# Patient Record
Sex: Female | Born: 1945 | Race: White | Marital: Married | Smoking: Never smoker
Health system: Southern US, Community
[De-identification: ages and names within clinical notes are randomized; demographics above are authoritative.]

## PROBLEM LIST (undated history)

## (undated) DIAGNOSIS — J309 Allergic rhinitis, unspecified: Secondary | ICD-10-CM

## (undated) DIAGNOSIS — Z8719 Personal history of other diseases of the digestive system: Secondary | ICD-10-CM

## (undated) DIAGNOSIS — K219 Gastro-esophageal reflux disease without esophagitis: Secondary | ICD-10-CM

## (undated) DIAGNOSIS — E039 Hypothyroidism, unspecified: Secondary | ICD-10-CM

## (undated) DIAGNOSIS — M810 Age-related osteoporosis without current pathological fracture: Secondary | ICD-10-CM

## (undated) DIAGNOSIS — K589 Irritable bowel syndrome without diarrhea: Secondary | ICD-10-CM

## (undated) DIAGNOSIS — M1990 Unspecified osteoarthritis, unspecified site: Secondary | ICD-10-CM

## (undated) HISTORY — DX: Hypothyroidism, unspecified: E03.9

## (undated) HISTORY — DX: Age-related osteoporosis without current pathological fracture: M81.0

## (undated) HISTORY — PX: APPENDECTOMY: SHX54

## (undated) HISTORY — DX: Personal history of other diseases of the digestive system: Z87.19

## (undated) HISTORY — DX: Irritable bowel syndrome, unspecified: K58.9

## (undated) HISTORY — DX: Allergic rhinitis, unspecified: J30.9

## (undated) HISTORY — PX: LAPAROSCOPIC SMALL BOWEL RESECTION: SUR793

## (undated) HISTORY — DX: Unspecified osteoarthritis, unspecified site: M19.90

## (undated) HISTORY — PX: RIGHT OOPHORECTOMY: SHX2359

## (undated) HISTORY — DX: Gastro-esophageal reflux disease without esophagitis: K21.9

---

## 2016-12-07 ENCOUNTER — Encounter: Payer: Self-pay | Admitting: Internal Medicine

## 2017-01-06 ENCOUNTER — Encounter: Payer: Self-pay | Admitting: *Deleted

## 2017-01-18 ENCOUNTER — Ambulatory Visit: Payer: Self-pay | Admitting: Internal Medicine

## 2017-01-23 ENCOUNTER — Ambulatory Visit (INDEPENDENT_AMBULATORY_CARE_PROVIDER_SITE_OTHER): Payer: Medicare Other | Admitting: Internal Medicine

## 2017-01-23 ENCOUNTER — Encounter: Payer: Self-pay | Admitting: Internal Medicine

## 2017-01-23 ENCOUNTER — Encounter (INDEPENDENT_AMBULATORY_CARE_PROVIDER_SITE_OTHER): Payer: Self-pay

## 2017-01-23 VITALS — BP 118/70 | HR 84 | Ht 66.0 in | Wt 125.5 lb

## 2017-01-23 DIAGNOSIS — K625 Hemorrhage of anus and rectum: Secondary | ICD-10-CM | POA: Diagnosis not present

## 2017-01-23 DIAGNOSIS — R1310 Dysphagia, unspecified: Secondary | ICD-10-CM

## 2017-01-23 DIAGNOSIS — K582 Mixed irritable bowel syndrome: Secondary | ICD-10-CM

## 2017-01-23 DIAGNOSIS — K648 Other hemorrhoids: Secondary | ICD-10-CM

## 2017-01-23 NOTE — Progress Notes (Signed)
Patient ID: Monique Walls, female   DOB: 09/11/1945, 71 y.o.   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 Kenton Walls to discuss dysphagia, IBS and intermittent rectal bleeding. She also has a history of hypothyroidism and osteoarthritis with osteoporosis. She has a surgical history notable for appendectomy, right oophorectomy and segmental small bowel resection for obstruction in February 2013.  She reports that overall she feels she is doing well. She does have intermittent flares of "irritable bowel". For her this is alternating diarrhea and constipation. Constipation seems to happen first often with changes in diet followed by loose stools. When she is constipated she notices more gas with occasional bloating. Other days she has regular formed stool once per day. She knows that overeating and also drinking wine will exacerbate symptoms. She does not take anything for her irritable bowel symptoms. She does report history of bright red blood per rectum which is very occasional. This is painless in nature. She does not have a history of known hemorrhoids. She reports her last colonoscopy was in 2013 with Dr. West Carbo and was reportedly normal. She denies a history of colon polyps or family history of colon cancer. She reports her gynecologist performed a rectal exam recently and was told this was normal.  She also reports over the last year having some solid food dysphagia. This is often without heartburn though she does endorse heartburn rarely. Sounds like she had an upper endoscopy years ago for globus and was later diagnosed with a small goiter. She reports at times she will have issues swallowing solid food particular foods that are dry. When this  occurs she states she has to stop eating and weight about 15 minutes. Usually this clears spontaneously and she can resume eating and drinking again. She denies weight loss, nausea, vomiting and odynophagia. Denies early satiety.  Past Medical History:  Diagnosis Date  . Allergic rhinitis   . GERD (gastroesophageal reflux disease)   . History of small bowel obstruction   . Hypothyroidism   . IBS (irritable bowel syndrome)   . Osteoarthritis   . Senile osteoporosis     Past Surgical History:  Procedure Laterality Date  . APPENDECTOMY    . LAPAROSCOPIC SMALL BOWEL RESECTION     due to obstruction  . RIGHT OOPHORECTOMY      Current Outpatient Prescriptions:  .  bisacodyl (DULCOLAX) 5 MG EC tablet, Take 5 mg by mouth as needed., Disp: , Rfl:  .  Calcium Citrate (CITRACAL PO), Take 1,200 mg by mouth daily., Disp: , Rfl:  .  Cholecalciferol (VITAMIN D3) 2000 units TABS, Take 1 tablet by mouth daily., Disp: , Rfl:  .  Glucosamine-Chondroitin-MSM (TRIPLE FLEX PO), Take 2 capsules by mouth daily., Disp: , Rfl:  .  Lactobacillus Rhamnosus, GG, (CULTURELLE PO), Take 1 capsule by mouth daily., Disp: , Rfl:  .  levothyroxine (SYNTHROID, LEVOTHROID) 50 MCG tablet, Take 1 tablet by mouth 2 days weekly, Disp: , Rfl:  .  levothyroxine (SYNTHROID, LEVOTHROID) 75 MCG tablet, Take 1 tablet by mouth 5 days weekly, Disp: , Rfl:  .  loratadine (CLARITIN) 10 MG tablet, Take 10 mg by mouth daily., Disp: , Rfl:  .  Multiple Vitamins-Minerals (PRESERVISION AREDS 2 PO), Take 1 capsule by mouth daily., Disp: ,  Rfl:   No Known Allergies  Family History  Problem Relation Age of Onset  . CAD Father   . Colon cancer Neg Hx   . Esophageal cancer Neg Hx   . Stomach cancer Neg Hx     Social History  Substance Use Topics  . Smoking status: Never Smoker  . Smokeless tobacco: Never Used  . Alcohol use 3.6 oz/week    6 Glasses of wine per week     Comment: occasional    ROS: As per history of present  illness, otherwise negative  BP 118/70   Pulse 84   Ht 5\' 6"  (1.676 m)   Wt 125 lb 8 oz (56.9 kg)   BMI 20.26 kg/m  Constitutional: Well-developed and well-nourished. No distress. HEENT: Normocephalic and atraumatic. Oropharynx is clear and moist. Conjunctivae are normal.  No scleral icterus. Neck: Neck supple. Trachea midline. Cardiovascular: Normal rate, regular rhythm and intact distal pulses. No M/R/G Pulmonary/chest: Effort normal and breath sounds normal. No wheezing, rales or rhonchi. Abdominal: Soft, nontender, nondistended. Bowel sounds active throughout. There are no masses palpable. No hepatosplenomegaly. Rectal: Small prolapsed internal hemorrhoids which reduce easily, no palpable masses or tenderness, hard brown stool in the rectal wall Extremities: no clubbing, cyanosis, or edema Neurological: Alert and oriented to person place and time. Skin: Skin is warm and dry.  Psychiatric: Normal mood and affect. Behavior is normal.  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 Kenton Walls to discuss dysphagia, IBS and intermittent rectal bleeding.   1. IBS with alternating loose stools and constipation -- long-standing issue for her. I recommended that she begin Benefiber 1-2 tablespoons daily to help with both loose stools and hard stools/constipation. Goal is 2 tablespoons daily over time. I also recommended and gave her a copy of the FODMAP diet.  We discussed focusing on foods that are low FODMAP foods.    2. Intermittent rectal bleeding -- likely internal hemorrhoids based on exam today. Her last colonoscopy was 5 years ago. I recommended Cologuard for screening at this time. Positive she understands she would need colonoscopy. If this issue  continues and Cologuard is negative she would be a candidate for banding for her hemorrhoids.  3. Dysphagia -- solid food related. We discussed the possibility of upper endoscopy but first will proceed to barium esophagram with tablet. This will evaluate motility and also rule out stricture and mass lesion. She understands upper endoscopy would be needed at this study is abnormal.      QM:[facility], [address], [place] [address]

## 2017-01-23 NOTE — Patient Instructions (Signed)
Please purchase the following medications over the counter and take as directed: Benefiber 1 tablespoon daily, working your way up to 2 tablespoons daily  We have given you a "FODMAP" diet to look over and follow.  Your provider has ordered Cologuard testing as an option for colon cancer screening. This is performed by [employer] and may be out of network with your insurance. PRIOR to completing the test, it is YOUR responsibility to contact your insurance about covered benefits for this test. Your out of pocket expense could be anywhere from $0.00 to $649.00.   When you call to check coverage with your insurer, please provide the following information:   -The ONLY provider of Cologuard is [place] code for Cologuard is [phone].  [employer] NPI # [id number]  -[employer] Tax ID # [id number]   We have already sent your demographic and insurance information to [employer] (phone number [phone]) and they should contact you within the next week regarding your test. If you have not heard from them within the next week, please call our office at [phone]. __________________________________________________________________ You have been scheduled for a Barium Esophogram at [facility] (1st floor of the hospital) on Thursday, 03/02/17 at 9:30 am. Please arrive 15 minutes prior to your appointment for registration. Make certain not to have anything to eat or drink 6 hours prior to your test. If you need to reschedule for any reason, please contact radiology at [phone] to do so. __________________________________________________________________ A barium swallow is an examination that concentrates on views of the esophagus. This tends to be a double contrast exam (barium and two liquids which, when combined, create a gas to distend the wall of the oesophagus) or single contrast (non-ionic iodine based). The study is  usually tailored to your symptoms so a good history is essential. Attention is paid during the study to the form, structure and configuration of the esophagus, looking for functional disorders (such as aspiration, dysphagia, achalasia, motility and reflux) EXAMINATION You may be asked to change into a gown, depending on the type of swallow being performed. A radiologist and radiographer will perform the procedure. The radiologist will advise you of the type of contrast selected for your procedure and direct you during the exam. You will be asked to stand, sit or lie in several different positions and to hold a small amount of fluid in your mouth before being asked to swallow while the imaging is performed .In some instances you may be asked to swallow barium coated marshmallows to assess the motility of a solid food bolus. The exam can be recorded as a digital or video fluoroscopy procedure. POST PROCEDURE It will take 1-2 days for the barium to pass through your system. To facilitate this, it is important, unless otherwise directed, to increase your fluids for the next 24-48hrs and to resume your normal diet.  This test typically takes about 30 minutes to perform. _______________________________________________________________________  If you are age 60 or older, your body mass index should be between 23-30. Your Body mass index is 20.26 kg/m. If this is out of the aforementioned range listed, please consider follow up with your Primary Care Provider.  If you are age 51 or younger, your body mass index should be between 19-25. Your Body mass index is 20.26 kg/m. If this is out of the aformentioned range listed, please consider follow up with your Primary Care Provider.

## 2017-03-02 ENCOUNTER — Ambulatory Visit (HOSPITAL_COMMUNITY)
Admission: RE | Admit: 2017-03-02 | Discharge: 2017-03-02 | Disposition: A | Discharge status: Home / Self Care | Payer: Medicare Other | Source: Ambulatory Visit | Attending: Internal Medicine | Admitting: Internal Medicine

## 2017-03-02 DIAGNOSIS — R1310 Dysphagia, unspecified: Secondary | ICD-10-CM | POA: Diagnosis not present

## 2017-03-27 ENCOUNTER — Telehealth: Payer: Self-pay

## 2017-03-27 NOTE — Telephone Encounter (Signed)
Cologuard has been suspended due to inactivity.

## 2017-04-27 ENCOUNTER — Other Ambulatory Visit: Payer: Self-pay

## 2017-04-27 LAB — COLOGUARD: Cologuard: NEGATIVE

## 2017-04-27 NOTE — Telephone Encounter (Signed)
Cologuard results are resulted as of today (04-27-2017)

## 2019-03-22 IMAGING — RF DG ESOPHAGUS
10 of 13 series · 13 of 24 positions shown · non-contrast
Comparison: None.

CLINICAL DATA: 71-year-old female with solid food dysphagia.

EXAM:
ESOPHOGRAM / BARIUM SWALLOW / BARIUM TABLET STUDY
TECHNIQUE: Combined double contrast and single contrast examination performed
using effervescent crystals, thick barium liquid, and thin barium
liquid. The patient was observed with fluoroscopy swallowing a 13 mm
barium sulphate tablet.
FLUOROSCOPY TIME:  Fluoroscopy Time:  1 minutes 30 seconds
Radiation Exposure Index (if provided by the fluoroscopic device):
E.FmNy
Number of Acquired Spot Images: 0

[Series 1: cp_standard · 0.54mm/px · 2 of 20 frames shown (1 of 10)]
[frame 4/20]
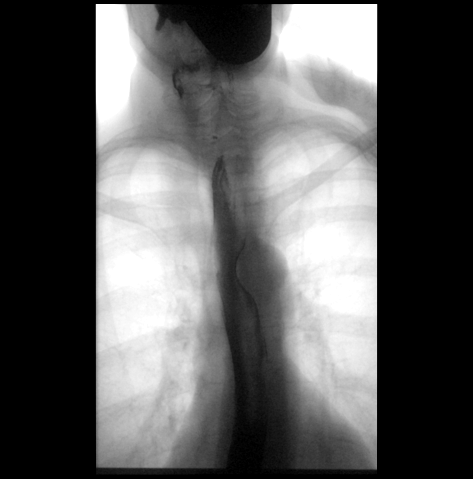
[frame 18/20]
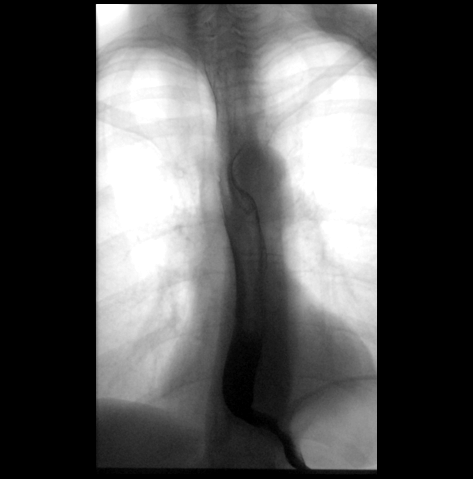

[Series 3: cp_standard · 0.18mm/px · 1 of 1 slices shown (2 of 10)]
[im 1/1]
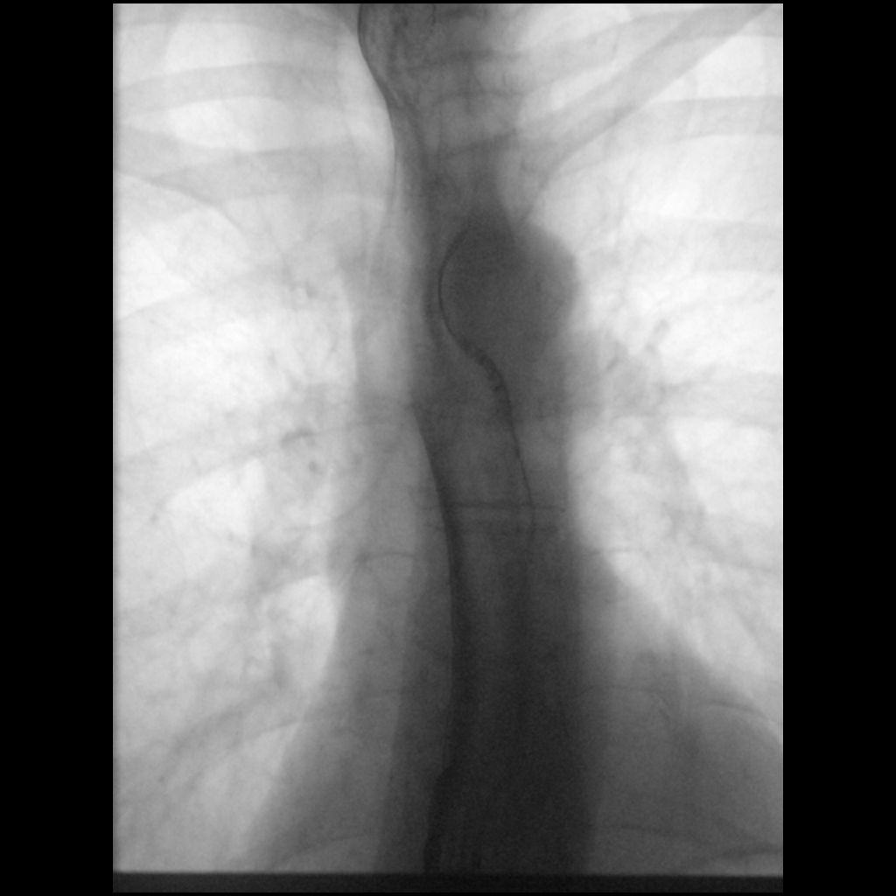

[Series 6: cp_standard · 0.18mm/px · 1 of 1 slices shown (3 of 10)]
[im 1/1]
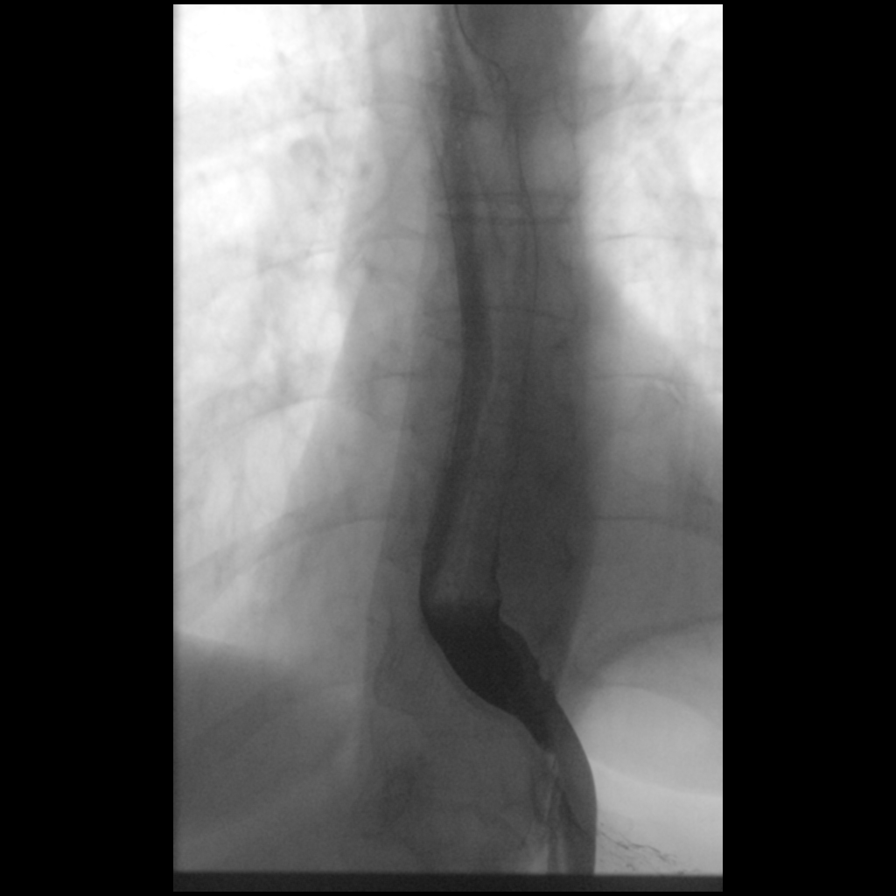

[Series 8: cp_standard · 0.26mm/px · 1 of 1 slices shown (4 of 10)]
[im 1/1]
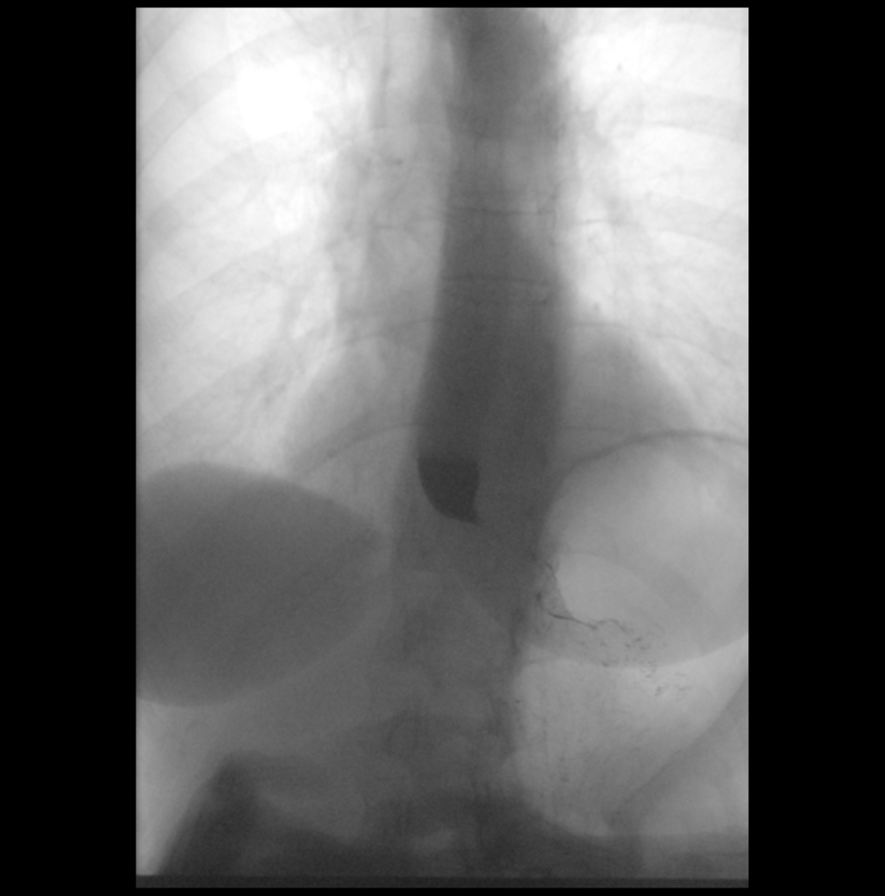

[Series 10: cp_standard · 0.35mm/px · 1 of 19 frames shown (5 of 10)]
[frame 10/19]
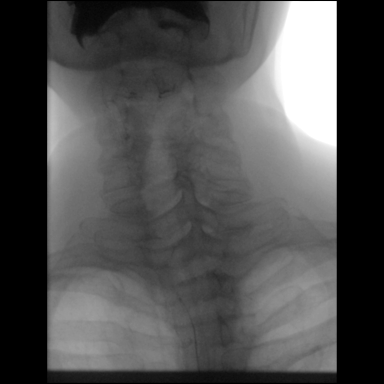

[Series 11: cp_standard · 0.35mm/px · 2 of 35 frames shown (6 of 10)]
[frame 6/35]
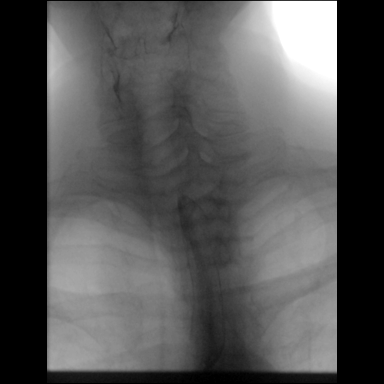
[frame 11/35]
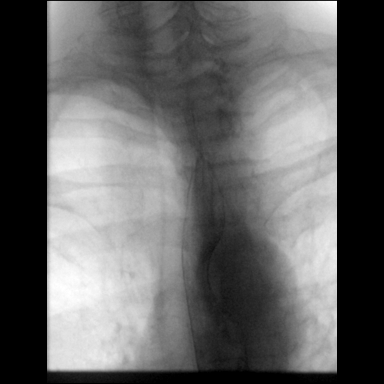

[Series 12: cp_standard · 0.34mm/px · 2 of 15 frames shown (7 of 10)]
[frame 3/15]
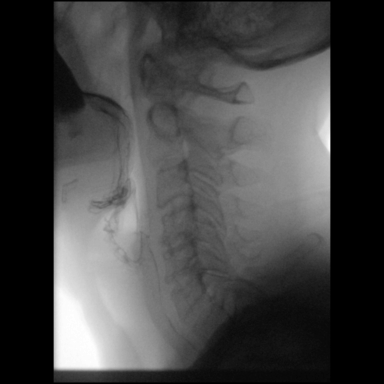
[frame 12/15]
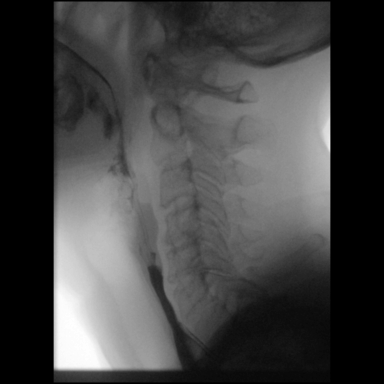

[Series 14: cp_standard · 0.27mm/px · 1 of 1 slices shown (8 of 10)]
[im 1/1]
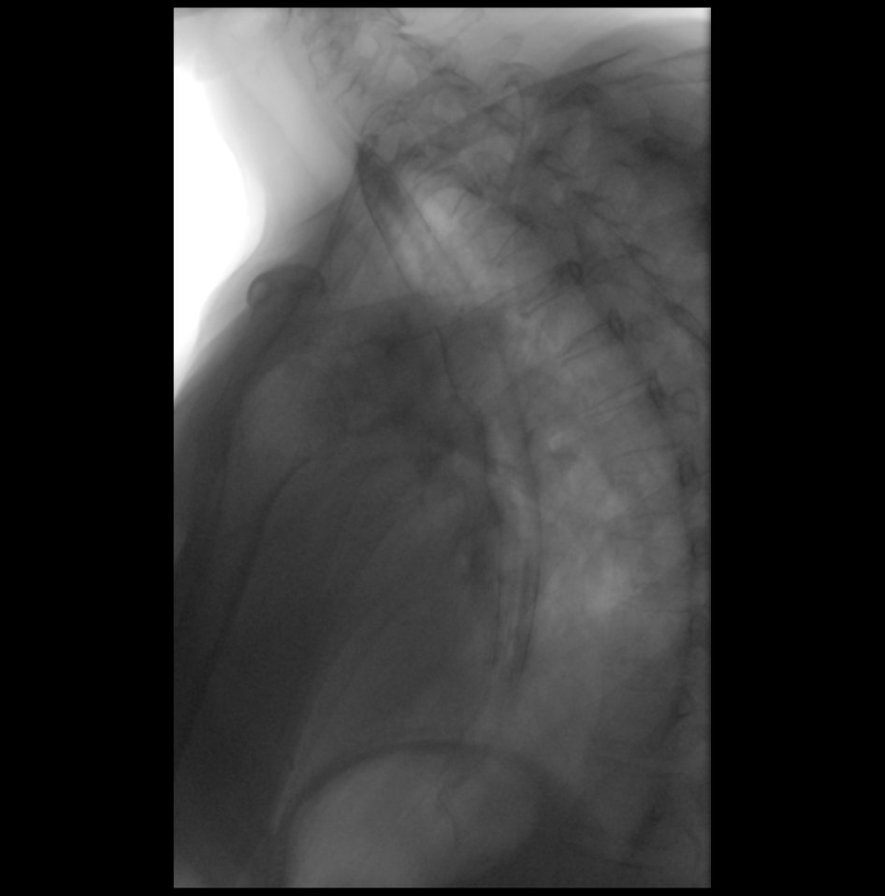

[Series 15: cp_standard · 0.55mm/px · 1 of 47 frames shown (9 of 10)]
[frame 8/47]
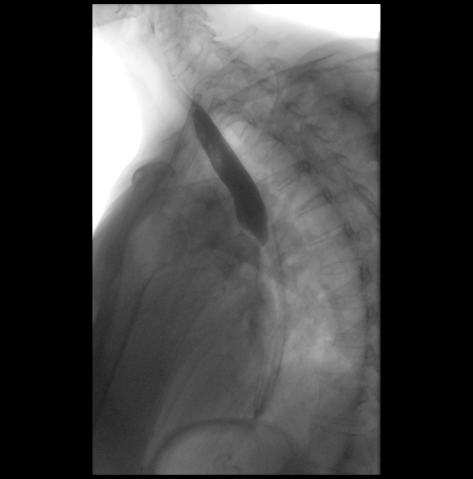

[Series 16: cp_standard · 0.18mm/px · 1 of 1 slices shown (10 of 10)]
[im 1/1]
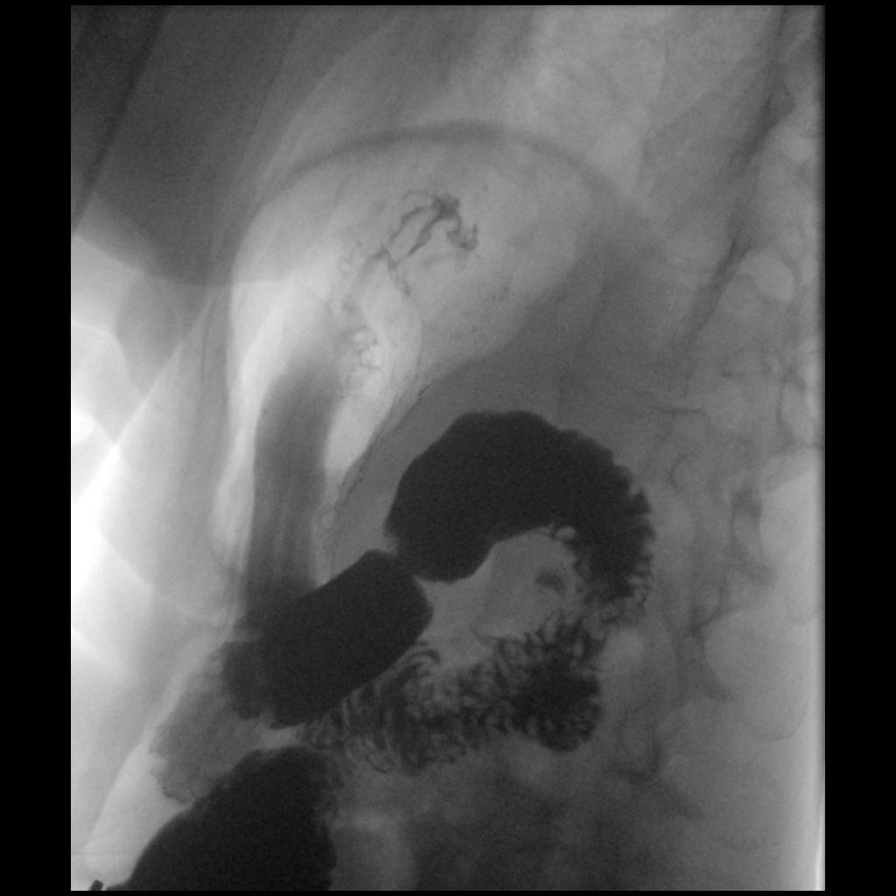

[13 of 24 positions shown; findings below may reference images not displayed]

FINDINGS: A double contrast study was undertaken and the patient tolerated
this well and without difficulty.

No obstruction to the forward flow of contrast throughout the
esophagus and into the stomach. Normal esophageal course and
contour. Normal esophageal mucosal pattern.

A 12.5 mm barium tablet was administered and passed freely to the
stomach without delay.

Dedicated imaging of the cervical esophagus is normal. No aspiration
occurred.

On prone swallow esophageal motility is normal for age.

Incidental prompt gastric emptying. No gastroesophageal reflux
occurred spontaneously or was elicited.
IMPRESSION: 1. Normal for age barium esophagram.
2. If dysphagia symptoms persist, perhaps an evaluation by Speech
Pathology would be valuable.

## 2020-01-29 ENCOUNTER — Telehealth: Payer: Self-pay | Admitting: *Deleted

## 2020-01-29 NOTE — Telephone Encounter (Signed)
error 

## 2020-03-11 ENCOUNTER — Telehealth: Payer: Self-pay | Admitting: *Deleted

## 2020-03-11 NOTE — Telephone Encounter (Signed)
Cologuard letter mailed to patient to advise that she may potentially need repeat cologuard.

## 2020-08-18 ENCOUNTER — Ambulatory Visit (INDEPENDENT_AMBULATORY_CARE_PROVIDER_SITE_OTHER): Payer: Medicare Other | Admitting: Internal Medicine

## 2020-08-18 ENCOUNTER — Encounter: Payer: Self-pay | Admitting: Internal Medicine

## 2020-08-18 ENCOUNTER — Ambulatory Visit: Payer: Medicare Other | Admitting: Internal Medicine

## 2020-08-18 VITALS — BP 150/80 | HR 76 | Ht 66.0 in | Wt 127.0 lb

## 2020-08-18 DIAGNOSIS — K582 Mixed irritable bowel syndrome: Secondary | ICD-10-CM

## 2020-08-18 DIAGNOSIS — K625 Hemorrhage of anus and rectum: Secondary | ICD-10-CM | POA: Diagnosis not present

## 2020-08-18 MED ORDER — SUTAB 1479-225-188 MG PO TABS: 1.0000 | ORAL_TABLET | ORAL | 0 refills | Status: DC

## 2020-08-18 NOTE — Patient Instructions (Addendum)
If you are age 75 or older, your body mass index should be between 23-30. Your Body mass index is 20.5 kg/m. If this is out of the aforementioned range listed, please consider follow up with your Primary Care Provider.  If you are age 67 or younger, your body mass index should be between 19-25. Your Body mass index is 20.5 kg/m. If this is out of the aformentioned range listed, please consider follow up with your Primary Care Provider.   You have been scheduled for a colonoscopy. Please follow written instructions given to you at your visit today.  Please pick up your prep supplies at the pharmacy within the next 1-3 days. If you use inhalers (even only as needed), please bring them with you on the day of your procedure.   It was great seeing you today!  Thank you for entrusting me with your care and choosing [facility].  Dr. Hilarie Fredrickson

## 2020-08-18 NOTE — Progress Notes (Signed)
Patient ID: STEPHINE LANGBEHN, female   DOB: 1946/06/06, 74 y.o.   MRN: [id number] HPI: Jaydy Fitzhenry is a 73 year old female with a history of GERD, prior small bowel obstruction, IBS, osteoporosis who is seen to evaluate rectal bleeding.  She is here alone today.  She is known to me from an office visit in May 2018 at which point we discussed her IBS with alternating bowel habits, intermittent rectal bleeding and dysphagia symptom.  At that visit her rectal bleeding was felt secondary to hemorrhoids and she had a negative Cologuard shortly after this visit.  We also performed a barium esophagram to evaluate dysphagia which was largely unremarkable.  She reports that she had been doing well but she developed some bright red blood per rectum which lasted over an approximate 5-day.  About a month ago.  She reports it was enough blood that she was concerned and wanted to be seen again.  She describes seeing some red blood per rectum after forceful gas was passed.  Bowel movements have not changed and vary between loose to hard.  No true diarrhea.  Bowel movements usually occur 1-3 times per day.  Stools can be thin or large in caliber.  She has not had abdominal bloating or gas symptom.  No abdominal pain.  Bleeding has subsequently resolved.  There was some mild perianal irritation when she was seeing the bleeding about a month ago.  Currently no upper GI or hepatobiliary complaint.  She had a negative colonoscopy in 2003 which I have documentation of done by Dr. West Carbo.  She specifically remembers a second colonoscopy 10 years later also by Dr. West Carbo in 2013.  She recalls this being normal.  I do not have record of the 2013 exam.  Past Medical History:  Diagnosis Date  . Allergic rhinitis   . GERD (gastroesophageal reflux disease)   . History of small bowel obstruction   . Hypothyroidism   . IBS (irritable bowel syndrome)   . Osteoarthritis   . Senile osteoporosis     Past Surgical History:   Procedure Laterality Date  . APPENDECTOMY    . LAPAROSCOPIC SMALL BOWEL RESECTION     due to obstruction  . RIGHT OOPHORECTOMY      Outpatient Medications Prior to Visit  Medication Sig Dispense Refill  . atorvastatin (LIPITOR) 20 MG tablet Take 20 mg by mouth daily.    . calcium carbonate (OS-CAL - DOSED IN MG OF ELEMENTAL CALCIUM) 1250 (500 Ca) MG tablet Take 1 tablet by mouth 2 (two) times daily with a meal.    . Glucosamine-Chondroitin-MSM (TRIPLE FLEX PO) Take 1 capsule by mouth daily.    . Lactobacillus Rhamnosus, GG, (CULTURELLE PO) Take 1 capsule by mouth daily.    Marland Kitchen levothyroxine (SYNTHROID, LEVOTHROID) 50 MCG tablet Take 1 tablet by mouth 2 days weekly    . levothyroxine (SYNTHROID, LEVOTHROID) 75 MCG tablet Take 1 tablet by mouth 5 days weekly    . Multiple Vitamins-Minerals (PRESERVISION AREDS 2 PO) Take 1 capsule by mouth daily.    . bisacodyl (DULCOLAX) 5 MG EC tablet Take 5 mg by mouth as needed.    . Calcium Citrate (CITRACAL PO) Take 1,200 mg by mouth daily.    . Cholecalciferol (VITAMIN D3) 2000 units TABS Take 1 tablet by mouth daily.    Marland Kitchen loratadine (CLARITIN) 10 MG tablet Take 10 mg by mouth daily.     No facility-administered medications prior to visit.    No Known Allergies  Family  History  Problem Relation Age of Onset  . CAD Father   . Colon cancer Neg Hx   . Esophageal cancer Neg Hx   . Stomach cancer Neg Hx     Social History   Tobacco Use  . Smoking status: Never Smoker  . Smokeless tobacco: Never Used  Vaping Use  . Vaping Use: Never used  Substance Use Topics  . Alcohol use: Yes    Alcohol/week: 6.0 standard drinks    Types: 6 Glasses of wine per week    Comment: occasional  . Drug use: No    ROS: As per history of present illness, otherwise negative  BP (!) 150/80   Pulse 76   Ht 5\' 6"  (1.676 m)   Wt 127 lb (57.6 kg)   BMI 20.50 kg/m  Gen: awake, alert, NAD HEENT: anicteric  CV: RRR, no mrg Pulm: CTA b/l Abd: soft, NT/ND,  +BS throughout Ext: no c/c/e Neuro: nonfocal  ASSESSMENT/PLAN: 74 year old female with a history of GERD, prior small bowel obstruction, IBS, osteoporosis who is seen to evaluate rectal bleeding.  1.  Rectal bleeding --now resolved and while this may be explained by internal hemorrhoids I recommended that we repeat colonoscopy to rule out other pathology.  We discussed the risk, benefits and alternatives and she is agreeable and wishes to proceed.  We discussed having this done in January or February, which is her preference as she will be going with her family to [place] over the holidays. --Colonoscopy in the [place]  2.  Alternating bowel habit --likely irritable bowel related which is longstanding.  I recommended that she try low-dose Benefiber 1 teaspoon working up to 1 tablespoon daily.  She may choose to start this after the colonoscopic evaluation    Cc: Dr. Truett Mainland, [facility]

## 2020-10-14 ENCOUNTER — Other Ambulatory Visit: Payer: Self-pay

## 2020-10-14 ENCOUNTER — Encounter: Payer: Self-pay | Admitting: Internal Medicine

## 2020-10-14 ENCOUNTER — Ambulatory Visit (AMBULATORY_SURGERY_CENTER): Payer: Medicare Other | Admitting: Internal Medicine

## 2020-10-14 VITALS — BP 138/70 | HR 76 | Temp 98.9°F | Resp 11 | Ht 66.0 in | Wt 127.0 lb

## 2020-10-14 DIAGNOSIS — K635 Polyp of colon: Secondary | ICD-10-CM

## 2020-10-14 DIAGNOSIS — K648 Other hemorrhoids: Secondary | ICD-10-CM

## 2020-10-14 DIAGNOSIS — D122 Benign neoplasm of ascending colon: Secondary | ICD-10-CM

## 2020-10-14 DIAGNOSIS — K5730 Diverticulosis of large intestine without perforation or abscess without bleeding: Secondary | ICD-10-CM

## 2020-10-14 DIAGNOSIS — K625 Hemorrhage of anus and rectum: Secondary | ICD-10-CM

## 2020-10-14 MED ORDER — SODIUM CHLORIDE 0.9 % IV SOLN: 500.0000 mL | Freq: Once | INTRAVENOUS | Status: DC

## 2020-10-14 NOTE — Progress Notes (Signed)
Pt Drowsy. VSS. To PACU, report to RN. No anesthetic complications noted.  

## 2020-10-14 NOTE — Patient Instructions (Signed)

## 2020-10-14 NOTE — Progress Notes (Signed)
Called to room to assist during endoscopic procedure.  Patient ID and intended procedure confirmed with present staff. Received instructions for my participation in the procedure from the performing physician.  

## 2020-10-14 NOTE — Progress Notes (Signed)
VS taken by C.W. 

## 2020-10-14 NOTE — Op Note (Signed)
[place] Patient Name: Monique Walls Procedure Date: 10/14/2020 2:04 PM MRN: [id number] Endoscopist: Jerene Bears , MD Age: 75 Referring MD:  Date of Birth: [birth year] Gender: Female Account #: [id number] Procedure:                Colonoscopy Indications:              Rectal bleeding Medicines:                Monitored Anesthesia Care Procedure:                Pre-Anesthesia Assessment:                           - Prior to the procedure, a History and Physical                            was performed, and patient medications and                            allergies were reviewed. The patient's tolerance of                            previous anesthesia was also reviewed. The risks                            and benefits of the procedure and the sedation                            options and risks were discussed with the patient.                            All questions were answered, and informed consent                            was obtained. Prior Anticoagulants: The patient has                            taken no previous anticoagulant or antiplatelet                            agents. ASA Grade Assessment: II - A patient with                            mild systemic disease. After reviewing the risks                            and benefits, the patient was deemed in                            satisfactory condition to undergo the procedure.                           After obtaining informed consent, the colonoscope  was passed under direct vision. Throughout the                            procedure, the patient's blood pressure, pulse, and                            oxygen saturations were monitored continuously. The                            Olympus [id number]-[id number]) Colonoscope was                            introduced through the anus and advanced to the                            cecum, identified by appendiceal orifice and                             ileocecal valve. The colonoscopy was performed                            without difficulty. The patient tolerated the                            procedure well. The quality of the bowel                            preparation was good. The ileocecal valve,                            appendiceal orifice, and rectum were photographed. Scope In: 2:10:44 PM Scope Out: 2:34:18 PM Scope Withdrawal Time: 0 hours 15 minutes 34 seconds  Total Procedure Duration: 0 hours 23 minutes 34 seconds  Findings:                 The digital rectal exam was normal.                           A 5 mm polyp was found in the ascending colon. The                            polyp was sessile. The polyp was removed with a                            cold snare. Resection and retrieval were complete.                           Multiple small and large-mouthed diverticula were                            found in the sigmoid colon, descending colon and                            transverse colon.  Internal hemorrhoids were found during                            retroflexion. The hemorrhoids were medium-sized. Complications:            No immediate complications. Estimated Blood Loss:     Estimated blood loss was minimal. Impression:               - One 5 mm polyp in the ascending colon, removed                            with a cold snare. Resected and retrieved.                           - Diverticulosis in the sigmoid colon, in the                            descending colon and in the transverse colon.                           - Internal hemorrhoids. This is the most likely                            source of bright red blood per rectum. Recommendation:           - Patient has a contact number available for                            emergencies. The signs and symptoms of potential                            delayed complications were discussed with the                             patient. Return to normal activities tomorrow.                            Written discharge instructions were provided to the                            patient.                           - Resume previous diet.                           - Continue present medications.                           - Await pathology results.                           - If hemorrhoidal bleeding continues to be a                            problem, hemorrhoidal banding would be a good  treatment option.                           - No recommendation at this time regarding repeat                            colonoscopy due to age at next                            screening/surveillance interval. Jerene Bears, MD 10/14/2020 2:40:39 PM This report has been signed electronically.

## 2020-10-16 ENCOUNTER — Telehealth: Payer: Self-pay | Admitting: *Deleted

## 2020-10-16 NOTE — Telephone Encounter (Signed)
  Follow up Call-  Call back number 10/14/2020  Post procedure Call Back phone  # [phone]  Permission to leave phone message Yes  Some recent data might be hidden     Patient questions:  Do you have a fever, pain , or abdominal swelling? No. Pain Score  0 *  Have you tolerated food without any problems? Yes.    Have you been able to return to your normal activities? Yes.    Do you have any questions about your discharge instructions: Diet   No. Medications  No. Follow up visit  No.  Do you have questions or concerns about your Care? No.  Actions: * If pain score is 4 or above: No action needed, pain <4.  1. Have you developed a fever since your procedure? no  2.   Have you had an respiratory symptoms (SOB or cough) since your procedure? no  3.   Have you tested positive for COVID 19 since your procedure no  4.   Have you had any family members/close contacts diagnosed with the COVID 19 since your procedure?  no   If yes to any of these questions please route to Joylene John, RN and Joella Prince, RN

## 2020-10-22 ENCOUNTER — Encounter: Payer: Self-pay | Admitting: Internal Medicine

## 2022-03-30 ENCOUNTER — Telehealth: Payer: Self-pay

## 2022-03-30 NOTE — Telephone Encounter (Signed)
Pt stated that she has been noticing for the past three weeks that she is having some rectal bleeding when she wipes: No other symptoms: Pt states that this is not a large amount of blood and she does have a history of hemorrhoids:  Pt was scheduled for an office visit with Vicie Mutters PA  for evaluation on 04/13/2022 at 1:30 PM: Pt verbalized understanding with all questions answered.

## 2022-04-12 NOTE — Progress Notes (Unsigned)
04/13/2022 Monique Walls [id number] 27-Aug-1946  Referring provider: Allena Napoleon Her* Primary GI doctor: Dr. Hilarie Fredrickson  ASSESSMENT AND PLAN:  Assessment: 76 y.o. female here for assessment of the following: 1. Rectal bleeding   2. Benign neoplasm of ascending colon   3. Irritable bowel syndrome with both constipation and diarrhea   4. Hemorrhoids, unspecified hemorrhoid type    10/14/2020 colonoscopy with Dr. Hilarie Fredrickson for rectal bleeding showed good bowel prep.  5 mm SS polyp in ascending colon diverticula sigmoid descending and transverse.  Internal hemorrhoids.   Plan: -Sitz baths, increase fiber, increase water, need to fix toilet habits.  -Hydrocortisone supp give and external cream sent in.  -We discussed hemorrhoid banding here in the office, will follow up after 2 months, if not improved can get evaluated with Dr. Hilarie Fredrickson.    Meds ordered this encounter  Medications   hydrocortisone (ANUSOL-HC) 2.5 % rectal cream    Sig: Place 1 Application rectally 2 (two) times daily.    Dispense:  30 g    Refill:  2   hydrocortisone (ANUSOL-HC) 25 MG suppository    Sig: Place 1 suppository (25 mg total) rectally 2 (two) times daily.    Dispense:  12 suppository    Refill:  0     History of Present Illness:  76 y.o. female  with a past medical history of GERD, prior small bowel obstruction, IBS, osteoporosis who is seen to evaluate rectal bleeding and others listed below, returns to clinic today for evaluation of rectal bleeding. 08/18/2020 office visit with Dr. Hilarie Fredrickson.   10/14/2020 colonoscopy with Dr. Hilarie Fredrickson for rectal bleeding showed good bowel prep.  5 mm SS polyp in ascending colon diverticula sigmoid descending and transverse.  Internal hemorrhoids.  Surveillance to be done in 5 years however due to age will evaluate closer time 2018 normal barium swallow for dysphagia. 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  Referred to [facility] hepatologist, abnormality thought most likely reflection of collateralization in setting of abdominal resection.  Hepatitis C negative for screening  Current Medications:   Current Outpatient Medications (Endocrine & Metabolic):    levothyroxine (SYNTHROID, LEVOTHROID) 50 MCG tablet, Take 1 tablet by mouth 2 days weekly   levothyroxine (SYNTHROID, LEVOTHROID) 75 MCG tablet, Take 1 tablet by mouth 5 days weekly  Current Outpatient Medications (Cardiovascular):    atorvastatin (LIPITOR) 20 MG tablet, Take 20 mg by mouth daily.   Current Outpatient Medications (Analgesics):    aspirin EC 81 MG tablet, Take 81 mg by mouth daily. Swallow whole.   Current Outpatient Medications (Other):    hydrocortisone (ANUSOL-HC) 2.5 % rectal cream, Place 1 Application rectally 2 (two) times daily.   hydrocortisone (ANUSOL-HC) 25 MG suppository, Place 1 suppository (25 mg total) rectally 2 (two) times daily.   Lactobacillus Rhamnosus, GG, (CULTURELLE PO), Take 1 capsule by mouth daily.   Lactobacillus-Inulin (CULTURELLE ADULT ULT BALANCE PO), Take by mouth daily.   Multiple Vitamins-Minerals (PRESERVISION AREDS 2 PO), Take 1 capsule by mouth daily.   Oyster Shell 500 MG TABS, Take by mouth daily.  Surgical History:  She  has a past surgical history that includes Laparoscopic small bowel resection; Right oophorectomy; and Appendectomy. Family History:  Her family history includes CAD in her father. Social History:   reports that she has never smoked. She has never used smokeless tobacco. She reports current alcohol use  of about 6.0 standard drinks of alcohol per week. She reports that she does not use drugs.  Current Medications, Allergies, Past Medical History, Past Surgical History, Family History and Social History were reviewed in [employer] record.  Physical Exam: BP 126/64   Pulse 70   Ht '5\' 6"'$  (1.676 m)   Wt  129 lb (58.5 kg)   BMI 20.82 kg/m  General:   Pleasant, well developed female in no acute distress Heart : Regular rate and rhythm; no murmurs Pulm: Clear anteriorly; no wheezing Abdomen:  Soft, Non-distended AB, Active bowel sounds. No tenderness . Without guarding and Without rebound, No organomegaly appreciated. Rectal: Normal external rectal exam, possible healing posterior fissure, normal rectal tone, large appreciated internal hemorrhoids, non-tender, no masses, scant brown stool, hemoccult Positive Extremities:  without  edema. Neurologic:  Alert and  oriented x4;  No focal deficits.  Psych:  Cooperative. Normal mood and affect.   Vladimir Crofts, PA-C 04/13/22

## 2022-04-13 ENCOUNTER — Encounter: Payer: Self-pay | Admitting: Physician Assistant

## 2022-04-13 ENCOUNTER — Ambulatory Visit (INDEPENDENT_AMBULATORY_CARE_PROVIDER_SITE_OTHER): Payer: Medicare Other | Admitting: Physician Assistant

## 2022-04-13 VITALS — BP 126/64 | HR 70 | Ht 66.0 in | Wt 129.0 lb

## 2022-04-13 DIAGNOSIS — D122 Benign neoplasm of ascending colon: Secondary | ICD-10-CM | POA: Diagnosis not present

## 2022-04-13 DIAGNOSIS — K649 Unspecified hemorrhoids: Secondary | ICD-10-CM | POA: Diagnosis not present

## 2022-04-13 DIAGNOSIS — K582 Mixed irritable bowel syndrome: Secondary | ICD-10-CM

## 2022-04-13 DIAGNOSIS — K625 Hemorrhage of anus and rectum: Secondary | ICD-10-CM

## 2022-04-13 MED ORDER — HYDROCORTISONE ACETATE 25 MG RE SUPP: 25.0000 mg | Freq: Two times a day (BID) | RECTAL | 0 refills | Status: DC

## 2022-04-13 MED ORDER — HYDROCORTISONE (PERIANAL) 2.5 % EX CREA: 1.0000 | TOPICAL_CREAM | Freq: Two times a day (BID) | CUTANEOUS | 2 refills | Status: DC

## 2022-04-13 NOTE — Patient Instructions (Addendum)
Please do sitz baths- these can be found at the pharmacy. It is a English as a second language teacher that is put in your toliet.  Will send in hydrocoritsone suppository, cheapest with [employer]RX from [employer], [employer], [employer] or [employer] if your insurance does not pay for it. If the hemorrhoid suppository sent in is too expensive you can do this over the counter trick.  Apply a pea size amount of over the counter Anusol HC cream to the tip of an over the counter PrepH suppository and insert rectally once every night for at least 7 nights.  If this does not improve there are procedures that can be done.   Miralax is an osmotic laxative.  It only brings more water into the stool.  This is safe to take daily.  Can take up to 17 gram of miralax twice a day.  Mix with juice or coffee.  Start 1 capful at night for 3-4 days and reassess your response in 3-4 days.  You can increase and decrease the dose based on your response.  Remember, it can take up to 3-4 days to take effect OR for the effects to wear off.   I often pair this with benefiber in the morning to help assure the stool is not too loose.   Toileting tips to help with your constipation - Drink at least 64-80 ounces of water/liquid per day. - Establish a time to try to move your bowels every day.  For many people, this is after a cup of coffee or after a meal such as breakfast. - Sit all of the way back on the toilet keeping your back fairly straight and while sitting up, try to rest the tops of your forearms on your upper thighs.   - Raising your feet with a step stool/squatty potty can be helpful to improve the angle that allows your stool to pass through the rectum. - Relax the rectum feeling it bulge toward the toilet water.  If you feel your rectum raising toward your body, you are contracting rather than relaxing. - Breathe in and slowly exhale. "Belly breath" by expanding your belly towards your belly button. Keep belly expanded as you gently direct  pressure down and back to the anus.  A low pitched GRRR sound can assist with increasing intra-abdominal pressure.  - Repeat 3-4 times. If unsuccessful, contract the pelvic floor to restore normal tone and get off the toilet.  Avoid excessive straining. - To reduce excessive wiping by teaching your anus to normally contract, place hands on outer aspect of knees and resist knee movement outward.  Hold 5-10 second then place hands just inside of knees and resist inward movement of knees.  Hold 5 seconds.  Repeat a few times each way.   About Hemorrhoids  Hemorrhoids are swollen veins in the lower rectum and anus.  Also called piles, hemorrhoids are a common problem.  Hemorrhoids may be internal (inside the rectum) or external (around the anus).  Internal Hemorrhoids  Internal hemorrhoids are often painless, but they rarely cause bleeding.  The internal veins may stretch and fall down (prolapse) through the anus to the outside of the body.  The veins may then become irritated and painful.  External Hemorrhoids  External hemorrhoids can be easily seen or felt around the anal opening.  They are under the skin around the anus.  When the swollen veins are scratched or broken by straining, rubbing or wiping they sometimes bleed.  How Hemorrhoids Occur  Veins in the  rectum and around the anus tend to swell under pressure.  Hemorrhoids can result from increased pressure in the veins of your anus or rectum.  Some sources of pressure are:  Straining to have a bowel movement because of constipation Waiting too long to have a bowel movement Coughing and sneezing often Sitting for extended periods of time, including on the toilet Diarrhea Obesity Trauma or injury to the anus Some liver diseases Stress Family history of hemorrhoids Pregnancy  Pregnant women should try to avoid becoming constipated, because they are more likely to have hemorrhoids during pregnancy.  In the last trimester of pregnancy,  the enlarged uterus may press on blood vessels and causes hemorrhoids.  In addition, the strain of childbirth sometimes causes hemorrhoids after the birth.  Symptoms of Hemorrhoids  Some symptoms of hemorrhoids include: Swelling and/or a tender lump around the anus Itching, mild burning and bleeding around the anus Painful bowel movements with or without constipation Bright red blood covering the stool, on toilet paper or in the toilet bowel.   Symptoms usually go away within a few days.  Always talk to your doctor about any bleeding to make sure it is not from some other causes.  Diagnosing and Treating Hemorrhoids  Diagnosis is made by an examination by your healthcare provider.  Special test can be performed by your doctor.    Most cases of hemorrhoids can be treated with: High-fiber diet: Eat more high-fiber foods, which help prevent constipation.  Ask for more detailed fiber information on types and sources of fiber from your healthcare provider. Fluids: Drink plenty of water.  This helps soften bowel movements so they are easier to pass. Sitz baths and cold packs: Sitting in lukewarm water two or three times a day for 15 minutes cleases the anal area and may relieve discomfort.  If the water is too hot, swelling around the anus will get worse.  Placing a cloth-covered ice pack on the anus for ten minutes four times a day can also help reduce selling.  Gently pushing a prolapsed hemorrhoid back inside after the bath or ice pack can be helpful. Medications: For mild discomfort, your healthcare provider may suggest over-the-counter pain medication or prescribe a cream or ointment for topical use.  The cream may contain witch hazel, zinc oxide or petroleum jelly.  Medicated suppositories are also a treatment option.  Always consult your doctor before applying medications or creams. Procedures and surgeries: There are also a number of procedures and surgeries to shrink or remove hemorrhoids in  more serious cases.  Talk to your physician about these options.  You can often prevent hemorrhoids or keep them from becoming worse by maintaining a healthy lifestyle.  Eat a fiber-rich diet of fruits, vegetables and whole grains.  Also, drink plenty of water and exercise regularly.   2007, [employer] Doc.30  I appreciate the  opportunity to care for you  Thank You   [facility]

## 2022-04-14 NOTE — Progress Notes (Signed)
Addendum: Reviewed and agree with assessment and management plan. Deb Loudin M, MD  

## 2022-06-10 ENCOUNTER — Ambulatory Visit: Payer: Medicare Other | Admitting: Internal Medicine

## 2022-06-21 ENCOUNTER — Ambulatory Visit: Payer: Medicare Other | Admitting: Internal Medicine

## 2022-06-28 ENCOUNTER — Encounter: Payer: Self-pay | Admitting: Internal Medicine

## 2022-06-28 ENCOUNTER — Ambulatory Visit (INDEPENDENT_AMBULATORY_CARE_PROVIDER_SITE_OTHER): Payer: Medicare Other | Admitting: Internal Medicine

## 2022-06-28 VITALS — BP 124/80 | HR 86 | Ht 66.0 in | Wt 130.2 lb

## 2022-06-28 DIAGNOSIS — K582 Mixed irritable bowel syndrome: Secondary | ICD-10-CM

## 2022-06-28 DIAGNOSIS — K648 Other hemorrhoids: Secondary | ICD-10-CM | POA: Diagnosis not present

## 2022-06-28 NOTE — Progress Notes (Signed)
   Subjective:    Patient ID: Monique Walls, female    DOB: [birth year], 76 y.o.   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  She is here alone today and was last seen on 04/13/2022 by Tye Savoy.  She reports that she is doing well.  Her irritable bowel and bowel habits are very controlled as long as she is at home eating a very routine diet.  However she enjoys traveling and when she travels stools at times can vary and she can have painless rectal bleeding.  She attributes this to her hemorrhoids.  Can even occur with "forceful gas".  Always bright red in nature.  Certainly intermittent and not daily.  She was eating fiber 1 cereal which seem to be helpful but she has found it hard to find.  She took Metamucil 1 time and felt like her stools were too loose.  She is not having nominal pain.  She denies upper GI and hepatobiliary complaint.  She has thought about hemorrhoidal banding but has more questions and worries about timing.  She will be traveling to [place] in the [place] on a 4-week trip including cruise from Thanksgiving week to mid December.   Review of Systems As per HPI, otherwise negative  Current Medications, Allergies, Past Medical History, Past Surgical History, Family History and Social History were reviewed in [employer] record.    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  Bleeding internal hemorrhoids --we discussed her hemorrhoids today and I do think she would be an excellent candidate for hemorrhoidal banding.  She wishes to defer this today until her husband can be here to drive her home.  She also does not want to proceed until she is back from her [place] vacation.  She is interested in January 2024.  In the interim I recommended that she continue high-fiber diet and consider trying Benefiber 1 teaspoon to 1 tablespoon daily. -- Hemorrhoidal banding visit in January 2024  2.  IBS with alternating bowel habit --continue high-fiber diet with liberal fluid intake.  I recommended she consider Benefiber 1 teaspoon to 1 table twice daily.  3.  Small SSP of the colon --colonoscopy performed February 2022, repeat for surveillance would not be indicated before February 2027 at which point she will be 76 years old.  We can discuss at that time but would like to be deferred due to age.  4.  Intrahepatic shunt --seen by Dr. Damita Lack at [facility].  This is benign and not causing any evidence of portal hypertension.  No reimaging needed.  30 minutes total spent today including patient facing time, coordination of care, reviewing medical history/procedures/pertinent radiology studies, and documentation of the encounter.

## 2022-06-28 NOTE — Patient Instructions (Signed)
Start- Benefiber take 1 teaspoon by mouth once daily, you may titrate up to 1 tablespoon once daily, if needed.   You will be contacted at later time to schedule hemorrhoids banding in January 2024.   _______________________________________________________  If you are age 77 or older, your body mass index should be between 23-30. Your Body mass index is 21.02 kg/m. If this is out of the aforementioned range listed, please consider follow up with your Primary Care Provider.  If you are age 77 or younger, your body mass index should be between 19-25. Your Body mass index is 21.02 kg/m. If this is out of the aformentioned range listed, please consider follow up with your Primary Care Provider.   ________________________________________________________  The [facility] providers would like to encourage you to use [facility] to communicate with providers for non-urgent requests or questions.  Due to long hold times on the telephone, sending your provider a message by [facility] may be a faster and more efficient way to get a response.  Please allow 48 business hours for a response.  Please remember that this is for non-urgent requests.  _______________________________________________________  Thank you for choosing me and [facility].  Dr. Ulice Dash Pyrtle

## 2022-07-01 ENCOUNTER — Telehealth: Payer: Self-pay | Admitting: *Deleted

## 2022-07-01 NOTE — Telephone Encounter (Signed)
-----   Message from Larina Bras, [place] sent at 07/01/2022  9:24 AM EDT -----  ----- Message ----- From: Jerene Bears, MD Sent: 06/28/2022   2:35 PM EDT To: Larina Bras, CMA; Rovonda Amedeo Gory, CMA  Pt would like banding appt in Jan 2024 Thanks JMP

## 2022-07-01 NOTE — Telephone Encounter (Signed)
Patient has been scheduled for banding on 09/07/22 at 11 am. Patient is in agreement with this plan.

## 2022-09-07 ENCOUNTER — Encounter: Payer: Medicare Other | Admitting: Internal Medicine

## 2023-09-29 ENCOUNTER — Telehealth: Payer: Self-pay | Admitting: Internal Medicine

## 2023-09-29 DIAGNOSIS — Z23 Encounter for immunization: Secondary | ICD-10-CM

## 2023-09-29 NOTE — Telephone Encounter (Signed)
Inbound call from patient's husband, would like to schedule patient for Prevnar 21 vaccination. Please advise.

## 2023-10-02 NOTE — Telephone Encounter (Signed)
Dr Demetrius Charity-  Please advise.. should patient get this at PCP or do you want [place] to administer?

## 2023-10-02 NOTE — Telephone Encounter (Signed)
If we have that vaccine, she can get it here Medicare will cover it JMP

## 2023-10-02 NOTE — Telephone Encounter (Signed)
Left message for patient to call back

## 2023-10-03 NOTE — Telephone Encounter (Signed)
Patient advised of pneumococcal 21 vaccine appointment time/date. She verbalizes understanding.

## 2023-10-06 ENCOUNTER — Ambulatory Visit (INDEPENDENT_AMBULATORY_CARE_PROVIDER_SITE_OTHER): Payer: Medicare Other

## 2023-10-06 DIAGNOSIS — Z23 Encounter for immunization: Secondary | ICD-10-CM

## 2023-12-25 ENCOUNTER — Telehealth: Payer: Self-pay | Admitting: Internal Medicine

## 2023-12-25 NOTE — Telephone Encounter (Signed)
 Requesting to speak with a nurse to discuss rectal bleeding and possible apt with provider.   Offered soonest apt with pa for tomorrow and patient declined.   Please advise. Thank you

## 2023-12-25 NOTE — Telephone Encounter (Signed)
 Left message for pt to call back.  Pt scheduled to see Loa Riling PA tomorrow at 1:30pm. Pt aware of appt.

## 2023-12-26 ENCOUNTER — Encounter: Payer: Self-pay | Admitting: Gastroenterology

## 2023-12-26 ENCOUNTER — Ambulatory Visit (INDEPENDENT_AMBULATORY_CARE_PROVIDER_SITE_OTHER): Admitting: Gastroenterology

## 2023-12-26 VITALS — BP 130/78 | HR 70 | Ht 66.0 in | Wt 132.0 lb

## 2023-12-26 DIAGNOSIS — K648 Other hemorrhoids: Secondary | ICD-10-CM | POA: Insufficient documentation

## 2023-12-26 DIAGNOSIS — K5900 Constipation, unspecified: Secondary | ICD-10-CM | POA: Diagnosis not present

## 2023-12-26 DIAGNOSIS — K625 Hemorrhage of anus and rectum: Secondary | ICD-10-CM | POA: Insufficient documentation

## 2023-12-26 NOTE — Patient Instructions (Signed)
 Start Miralax 1 capful daily in 8 ounces of liquid.  Follow up tomorrow for hemorrhoid banding.   _______________________________________________________  If your blood pressure at your visit was 140/90 or greater, please contact your primary care physician to follow up on this.  _______________________________________________________  If you are age 79 or older, your body mass index should be between 23-30. Your Body mass index is 21.31 kg/m. If this is out of the aforementioned range listed, please consider follow up with your Primary Care Provider.  If you are age 67 or younger, your body mass index should be between 19-25. Your Body mass index is 21.31 kg/m. If this is out of the aformentioned range listed, please consider follow up with your Primary Care Provider.   ________________________________________________________  The [facility] providers would like to encourage you to use [facility] to communicate with providers for non-urgent requests or questions.  Due to long hold times on the telephone, sending your provider a message by [facility] may be a faster and more efficient way to get a response.  Please allow 48 business hours for a response.  Please remember that this is for non-urgent requests.  _______________________________________________________

## 2023-12-26 NOTE — Progress Notes (Signed)
 12/26/2023 Monique Walls [id number] 1946/05/11   HISTORY OF PRESENT ILLNESS: This is a 78 year old female who is a patient of Dr. Marietta Shorter.  She is here today with complaints of rectal bleeding.  She has had rectal bleeding on and off for years.  In fact, colonoscopy in 2022 as below was performed for complaints of rectal bleeding.  Internal hemorrhoids thought to be the cause of the bleeding.  She continues to have red rectal bleeding on and off.  Passes red blood with stools, at least small amounts most days, even passes small amounts of blood when passing gas at times.  Has used hydrocortisone  suppositories in the past with no improvement.  She notes some fullness in her bottom, sometimes protrusion.  Does note that she seems to be more constipated than she used to be.  Will take Dulcolax, only taking 1 at a time here and there, but does not feel like that has worked for her.  Colonoscopy 10/2020:  - One 5 mm polyp in the ascending colon, removed with a cold snare. Resected and retrieved. - Diverticulosis in the sigmoid colon, in the descending colon and in the transverse colon. - Internal hemorrhoids. This is the most likely source of bright red blood per rectum.  Surgical [P], colon, ascending, polyp - SESSILE SERRATED POLYP (2 OF 3 FRAGMENTS) - BENIGN COLONIC MUCOSA (1 OF 3 FRAGMENTS) - NO HIGH GRADE DYSPLASIA OR MALIGNANCY IDENTIFIED  Past Medical History:  Diagnosis Date   Allergic rhinitis    GERD (gastroesophageal reflux disease)    History of small bowel obstruction    Hypothyroidism    IBS (irritable bowel syndrome)    Osteoarthritis    Senile osteoporosis    Past Surgical History:  Procedure Laterality Date   APPENDECTOMY     LAPAROSCOPIC SMALL BOWEL RESECTION     due to obstruction   RIGHT OOPHORECTOMY      reports that she has never smoked. She has never used smokeless tobacco. She reports current alcohol use of about 6.0 standard drinks of alcohol per week. She  reports that she does not use drugs. family history includes CAD in her father. No Known Allergies    Outpatient Encounter Medications as of 12/26/2023  Medication Sig   Ascorbic Acid (VITA-C PO) Take by mouth.   aspirin EC 81 MG tablet Take 81 mg by mouth daily. Swallow whole.   atorvastatin (LIPITOR) 20 MG tablet Take 20 mg by mouth daily.   diltiazem (CARDIZEM CD) 180 MG 24 hr capsule Take 180 mg by mouth daily.   Lactobacillus-Inulin (CULTURELLE ADULT ULT BALANCE PO) Take by mouth daily.   levothyroxine (SYNTHROID, LEVOTHROID) 50 MCG tablet Take 1 tablet by mouth 3 days weekly   levothyroxine (SYNTHROID, LEVOTHROID) 75 MCG tablet Take 1 tablet by mouth 4 days weekly   Multiple Vitamins-Minerals (PRESERVISION AREDS 2 PO) Take 1 capsule by mouth daily.   Oyster Shell 500 MG TABS Take by mouth daily.   [DISCONTINUED] hydrocortisone  (ANUSOL -HC) 2.5 % rectal cream Place 1 Application rectally 2 (two) times daily. (Patient not taking: Reported on 06/28/2022)   [DISCONTINUED] hydrocortisone  (ANUSOL -HC) 25 MG suppository Place 1 suppository (25 mg total) rectally 2 (two) times daily. (Patient not taking: Reported on 06/28/2022)   [DISCONTINUED] Lactobacillus Rhamnosus, GG, (CULTURELLE PO) Take 1 capsule by mouth daily.   No facility-administered encounter medications on file as of 12/26/2023.    REVIEW OF SYSTEMS  : All other systems reviewed and negative except where noted  in the History of Present Illness.   PHYSICAL EXAM: BP 130/78   Pulse 70   Ht 5\' 6"  (1.676 m)   Wt 132 lb (59.9 kg)   BMI 21.31 kg/m  General: Well developed white female in no acute distress Head: Normocephalic and atraumatic Eyes:  Sclerae anicteric, conjunctiva pink. Ears: Normal auditory acuity Musculoskeletal: Symmetrical with no gross deformities  Skin: No lesions on visible extremities Neurological: Alert oriented x 4, grossly non-focal Psychological:  Alert and cooperative. Normal mood and  affect  ASSESSMENT AND PLAN: *Rectal bleeding: Due to internal hemorrhoids.  Last colonoscopy 2022 for complaints of rectal bleeding, hemorrhoids thought to be the cause at that time.  Has tried suppositories without improvement in the past.  Desires hemorrhoid banding.  Dr. Bridgett Camps actually had a cancellation tomorrow so we will go ahead and put her in that slot for hemorrhoid banding procedure.  She also reports some constipation issues as well so I suggested that she begin taking MiraLAX 1 capful mixed in 8 ounces of liquid daily going forward to keep stools soft and prevent straining/recurrence of hemorrhoids.  CC:  Devra Fontana Her*

## 2023-12-27 ENCOUNTER — Encounter: Payer: Self-pay | Admitting: Internal Medicine

## 2023-12-27 ENCOUNTER — Ambulatory Visit (INDEPENDENT_AMBULATORY_CARE_PROVIDER_SITE_OTHER): Admitting: Internal Medicine

## 2023-12-27 VITALS — BP 106/70 | HR 76 | Ht 66.0 in | Wt 131.2 lb

## 2023-12-27 DIAGNOSIS — K648 Other hemorrhoids: Secondary | ICD-10-CM

## 2023-12-27 DIAGNOSIS — K642 Third degree hemorrhoids: Secondary | ICD-10-CM

## 2023-12-27 NOTE — Patient Instructions (Signed)

## 2024-01-05 NOTE — Progress Notes (Signed)
 Monique Walls is a 78 year old female with a history of symptomatic bleeding and prolapsing internal hemorrhoids who presents for hemorrhoidal banding  Seen recently by Jessica Zehr, PA-C for the same  Symptoms prior to banding: Bleeding and prolapse   PROCEDURE NOTE:  The patient presents with symptomatic grade 3 internal hemorrhoids, requesting rubber band ligation of her hemorrhoidal disease.  All risks, benefits and alternative forms of therapy were described and informed consent was obtained.   The anorectum was pre-medicated with 0.125% nitroglycerin ointment The decision was made to band the RA internal hemorrhoid, and the [facility]'Regan System was used to perform band ligation without complication.   Digital anorectal examination was then performed to assure proper positioning of the band, and to adjust the banded tissue as required.  The patient was discharged home without pain or other issues.  Dietary and behavioral recommendations were given and along with follow-up instructions.     The following adjunctive treatments were recommended: Continue fiber and MiraLAX supplementation  The patient will return as scheduled for follow-up and possible additional banding as required. No complications were encountered and the patient tolerated the procedure well.

## 2024-01-25 NOTE — Progress Notes (Signed)
 Addendum: Reviewed and agree with assessment and management plan. Asha Grumbine, Carie Caddy, MD

## 2024-02-15 ENCOUNTER — Encounter: Payer: Self-pay | Admitting: Internal Medicine

## 2024-02-15 ENCOUNTER — Ambulatory Visit (INDEPENDENT_AMBULATORY_CARE_PROVIDER_SITE_OTHER): Admitting: Internal Medicine

## 2024-02-15 VITALS — BP 100/62 | Ht 63.0 in | Wt 131.2 lb

## 2024-02-15 DIAGNOSIS — K649 Unspecified hemorrhoids: Secondary | ICD-10-CM

## 2024-02-15 DIAGNOSIS — K648 Other hemorrhoids: Secondary | ICD-10-CM

## 2024-02-15 DIAGNOSIS — K642 Third degree hemorrhoids: Secondary | ICD-10-CM

## 2024-02-15 NOTE — Patient Instructions (Signed)

## 2024-02-16 NOTE — Progress Notes (Signed)
 Monique Walls is a 78 year old female with a history of symptomatic bleeding and prolapsing internal hemorrhoids who presents for additional hemorrhoidal banding  Symptoms prior to banding Bleeding and prolapse  She did well with the first treatment and feels that symptoms are improving   PROCEDURE NOTE:  The patient presents with symptomatic grade 3 internal hemorrhoids, requesting rubber band ligation of her hemorrhoidal disease.  All risks, benefits and alternative forms of therapy were described and informed consent was obtained.   The anorectum was pre-medicated with 0.125% nitroglycerin ointment The decision was made to band the LL (RA banded at initial visit) internal hemorrhoid, and the [facility] System was used to perform band ligation without complication.   Digital anorectal examination was then performed to assure proper positioning of the band, and to adjust the banded tissue as required.  The patient was discharged home without pain or other issues.  Dietary and behavioral recommendations were given and along with follow-up instructions.     The following adjunctive treatments were recommended: Continue fiber and MiraLAX supplementation  The patient will return as scheduled for follow-up and possible additional banding as required. No complications were encountered and the patient tolerated the procedure well.

## 2024-03-29 ENCOUNTER — Ambulatory Visit: Admitting: Internal Medicine

## 2024-03-29 ENCOUNTER — Encounter: Payer: Self-pay | Admitting: Internal Medicine

## 2024-03-29 VITALS — BP 110/80 | HR 74 | Ht 66.0 in | Wt 132.5 lb

## 2024-03-29 DIAGNOSIS — K648 Other hemorrhoids: Secondary | ICD-10-CM

## 2024-03-29 DIAGNOSIS — K642 Third degree hemorrhoids: Secondary | ICD-10-CM | POA: Diagnosis not present

## 2024-03-29 NOTE — Patient Instructions (Signed)

## 2024-03-29 NOTE — Progress Notes (Signed)
 Monique Walls is a 78 year old female with a history of symptomatic bleeding and prolapsing internal hemorrhoids who presents for hemorrhoidal banding appointment #3  Symptoms prior to banding include bleeding and prolapse  She did well with the first 2 treatments though had about an hour of severe pinching pain after leaving after her last appointment   PROCEDURE NOTE:  The patient presents with symptomatic grade 3 internal hemorrhoids, requesting rubber band ligation of her hemorrhoidal disease.  All risks, benefits and alternative forms of therapy were described and informed consent was obtained.   The anorectum was pre-medicated with 0.125% nitroglycerin ointment The decision was made to band the RP (RA and LL banded previously) internal hemorrhoid, and the [facility] System was used to perform band ligation without complication.   Digital anorectal examination was then performed to assure proper positioning of the band, and to adjust the banded tissue as required.  The patient was discharged home without pain or other issues.  Dietary and behavioral recommendations were given and along with follow-up instructions.     The following adjunctive treatments were recommended: Continue MiraLAX and fiber  The patient will return as needed for follow-up and possible additional banding as required. No complications were encountered and the patient tolerated the procedure well.

## 2024-05-03 ENCOUNTER — Telehealth: Payer: Self-pay | Admitting: Internal Medicine

## 2024-05-03 NOTE — Telephone Encounter (Signed)
 Patient states that she is again having rectal bleeding following her 3rd hemorrhoidal banding (03/2024). She says this is brb and is present mostly just on toilet tissue. She denies any abdominal pain, rectal pain.   Patient has been scheduled for hemorrhoidal banding 07/02/24 (first available) and is advised to use hydrocortisone  cream and or preparation H suppositories as needed for bleeding until her appointment. When asked if she uses Miralax to maintain soft bowels, patient states that she has tried and it did not help her. States she has lots of gas with rapid evacuation of stool which is when she notices bleeding. I then asked patient if she has tried Benefiber/Metamucil to help bulk the stool while keeping them soft. She states Ive tried all that stuff and none of it works for me.  Patient is advised that if she develops rectal pain, abdominal pain, profuse bleeding between now and her appointment date, she should make us  aware.

## 2024-05-03 NOTE — Telephone Encounter (Signed)
 Inbound call from patient stating she is still having rectal bleeding. Requesting a call to discuss further. Believes she may need another banding appointment. Please advise, thank you

## 2024-05-03 NOTE — Telephone Encounter (Signed)
 Left message for patient to call back

## 2024-07-02 ENCOUNTER — Ambulatory Visit (INDEPENDENT_AMBULATORY_CARE_PROVIDER_SITE_OTHER): Admitting: Internal Medicine

## 2024-07-02 ENCOUNTER — Encounter: Payer: Self-pay | Admitting: Internal Medicine

## 2024-07-02 VITALS — BP 110/90 | HR 73 | Ht 67.0 in | Wt 137.5 lb

## 2024-07-02 DIAGNOSIS — K648 Other hemorrhoids: Secondary | ICD-10-CM

## 2024-07-02 DIAGNOSIS — K641 Second degree hemorrhoids: Secondary | ICD-10-CM

## 2024-07-02 NOTE — Progress Notes (Signed)
 Monique Walls is a 78 year old female with persistent and intermittent symptomatic bleeding internal hemorrhoids.  She has had rubber band ligation x 3 last on 03/29/2024. Her hemorrhoidal symptoms are definitively better since having undergone band ligation but has some intermittent small-volume red blood with passing stool and wiping.  Painless in nature.  Seems to occur more frequently if she expel stool quickly.  She is eating prunes and describes her stools as fairly regular, not too hard or too loose.  Not using Metamucil or MiraLAX at present.  Her last colonoscopy was in September 2022 and a small SSP was removed.   ANOSCOPY: Using a disposable, lubricated, slotted, self-illuminating anoscope, the rectum was intubated without difficulty. The trochar was removed and the ano-rectum was circumferentially inspected. There were internal hemorrhoids, RP > LL and no appreciable hemorrhoid in the anterior position. There was no finding of an anorectal fissure. The rectal mucosa was not inflamed. No neoplasia or other pathology was identified. The inspection was well tolerated.    PROCEDURE NOTE:  The patient presents with symptomatic grade 2 internal hemorrhoids, requesting rubber band ligation of her hemorrhoidal disease.  All risks, benefits and alternative forms of therapy were described and informed consent was obtained.   The anorectum was pre-medicated with 0.125% nitroglycerin ointment The decision was made to band the RP internal hemorrhoid, and the [facility] System was used to perform band ligation without complication.   Digital anorectal examination was then performed to assure proper positioning of the band, and to adjust the banded tissue as required.  The patient was discharged home without pain or other issues.  Dietary and behavioral recommendations were given and along with follow-up instructions.    The patient will return as needed  for follow-up and possible additional  banding as required.  Would be reasonable to band the left lateral hemorrhoid again if necessary.  Other than that for persistent symptoms would need surgical opinion. No complications were encountered and the patient tolerated the procedure well.

## 2024-07-02 NOTE — Patient Instructions (Signed)
